# Patient Record
Sex: Female | Born: 2005 | Race: Black or African American | Hispanic: No | Marital: Single | Smoking: Never smoker
Health system: Southern US, Community
[De-identification: ages and names within clinical notes are randomized; demographics above are authoritative.]

## PROBLEM LIST (undated history)

## (undated) ENCOUNTER — Emergency Department (HOSPITAL_COMMUNITY): Admission: EM | Payer: Medicaid Other | Source: Home / Self Care

## (undated) DIAGNOSIS — O24419 Gestational diabetes mellitus in pregnancy, unspecified control: Secondary | ICD-10-CM

## (undated) DIAGNOSIS — D649 Anemia, unspecified: Secondary | ICD-10-CM

## (undated) DIAGNOSIS — R569 Unspecified convulsions: Secondary | ICD-10-CM

## (undated) DIAGNOSIS — A749 Chlamydial infection, unspecified: Secondary | ICD-10-CM

## (undated) HISTORY — PX: NO PAST SURGERIES: SHX2092

---
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
## 2023-01-21 ENCOUNTER — Ambulatory Visit: Payer: Self-pay

## 2023-12-18 ENCOUNTER — Emergency Department (HOSPITAL_COMMUNITY)
Admission: EM | Admit: 2023-12-18 | Discharge: 2023-12-18 | Discharge status: Against Medical Advice | Source: Home / Self Care

## 2023-12-20 ENCOUNTER — Emergency Department (HOSPITAL_COMMUNITY)
Admission: EM | Admit: 2023-12-20 | Discharge: 2023-12-20 | Disposition: A | Discharge status: Home / Self Care | Attending: Emergency Medicine | Admitting: Emergency Medicine

## 2023-12-20 ENCOUNTER — Emergency Department (HOSPITAL_COMMUNITY)

## 2023-12-20 DIAGNOSIS — S61442A Puncture wound with foreign body of left hand, initial encounter: Secondary | ICD-10-CM | POA: Diagnosis present

## 2023-12-20 DIAGNOSIS — Z23 Encounter for immunization: Secondary | ICD-10-CM | POA: Insufficient documentation

## 2023-12-20 DIAGNOSIS — W458XXA Other foreign body or object entering through skin, initial encounter: Secondary | ICD-10-CM | POA: Insufficient documentation

## 2023-12-20 MED ORDER — TETANUS-DIPHTH-ACELL PERTUSSIS 5-2.5-18.5 LF-MCG/0.5 IM SUSY
0.5000 mL | PREFILLED_SYRINGE | Freq: Once | INTRAMUSCULAR | Status: AC
  Administered 2023-12-20: 0.5 mL via INTRAMUSCULAR
  Filled 2023-12-20: qty 0.5

## 2023-12-20 MED ORDER — CEPHALEXIN 500 MG PO CAPS: 500.0000 mg | ORAL_CAPSULE | Freq: Two times a day (BID) | ORAL | 0 refills | Status: AC

## 2023-12-20 MED ORDER — OXYCODONE HCL 5 MG PO TABS: 5.0000 mg | ORAL_TABLET | ORAL | Status: DC

## 2023-12-20 MED ORDER — ACETAMINOPHEN 500 MG PO TABS
1000.0000 mg | ORAL_TABLET | Freq: Once | ORAL | Status: AC
Start: 2023-12-20 — End: 2023-12-20
  Administered 2023-12-20: 1000 mg via ORAL
  Filled 2023-12-20: qty 2

## 2023-12-20 NOTE — ED Notes (Signed)
 EDP at bedside during triage. Assessed and removed object from her left hand. No blood noted.  Ice pack applied to area.

## 2023-12-20 NOTE — ED Provider Notes (Signed)
 [place] EMERGENCY DEPARTMENT AT [facility] Provider Note   CSN: [id number] Arrival date & time: 12/20/23  1533     History  Chief Complaint  Patient presents with   Foreign Body    Vicki Foster is a 18 y.o. female.  18 year old female previously healthy who is right-handed who presents to the emergency department with foreign object in her left hand.  Patient was checking the thermometer of chicken nuggets and accidentally poked it into her left hand just proximal to her left middle finger.  Was not through and through.  Object left in place and 911 was called.  No surgeries to that hand.  Denies any numbness or tingling of the hand distally.       Home Medications Prior to Admission medications   Medication Sig Start Date End Date Taking? Authorizing Provider  cephALEXin  (KEFLEX ) 500 MG capsule Take 1 capsule (500 mg total) by mouth 2 (two) times daily for 7 days. 12/20/23 12/27/23 Yes Ninetta Basket, MD  etonogestrel  (NEXPLANON ) 68 MG IMPL implant 1 each by Subdermal route once.    [provider]  Norethindrone-Ethinyl Estradiol-Fe Biphas (LO LOESTRIN FE) 1 MG-10 MCG / 10 MCG tablet Take 1 tablet by mouth daily. 11/12/22 02/10/23  Ozan, Jennifer, DO  ondansetron  (ZOFRAN -ODT) 4 MG disintegrating tablet Take 1 tablet (4 mg total) by mouth every 8 (eight) hours as needed for nausea or vomiting. 12/15/22   Schutt, Coni Deep, PA-C      Allergies    Tomato    Review of Systems   Review of Systems  Physical Exam Updated Vital Signs BP 131/76   Pulse 88   Temp 99 F (37.2 C) (Oral)   Resp 15   Ht 5\' 6"  (1.676 m)   Wt 83.9 kg   SpO2 100%   BMI 29.86 kg/m  Physical Exam Musculoskeletal:     Comments: See image below for foreign body that is in place.  After removal able to fully flex the middle finger MCP, PIP, and DIP.  Cap refill less than 2 seconds in distal left finger.  Intact sensation to light touch in the left fingers well.  Able to  fully flex and extend all other fingers.  No active bleeding.     ED Results / Procedures / Treatments   Labs (all labs ordered are listed, but only abnormal results are displayed) Labs Reviewed - No data to display  EKG None  Radiology No results found.  Procedures Procedures    Medications Ordered in ED Medications  Tdap (BOOSTRIX ) injection 0.5 mL (0.5 mLs Intramuscular Given 12/20/23 1615)  acetaminophen  (TYLENOL ) tablet 1,000 mg (1,000 mg Oral Given 12/20/23 1617)    ED Course/ Medical Decision Making/ A&P                                 Medical Decision Making Amount and/or Complexity of Data Reviewed Radiology: ordered.  Risk OTC drugs. Prescription drug management.   18 year old right-handed female presents emergency department with a foreign object in her left hand  Initial Ddx:  Foreign body, tendon injury, nerve injury, arterial injury  MDM/Course:  Patient presents emergency department with a meat thermometer that is lodged in her left hand.  It was removed without difficulty.  Does not appear to have any tendon injuries and unable to appreciate.  Neurovascular intact in her finger distally.  X-rays were obtained which did not  show any retained foreign body.  Will give her antibiotics prevent infection given the fact that the needle is in need to monitor.  Will have her follow-up with hand if she is having persistent symptoms.  This patient presents to the ED for concern of complaints listed in HPI, this involves an extensive number of treatment options, and is a complaint that carries with it a high risk of complications and morbidity. Disposition including potential need for admission considered.   Dispo: DC Home. Return precautions discussed including, but not limited to, those listed in the AVS. Allowed pt time to ask questions which were answered fully prior to dc.  Records reviewed Outpatient Clinic Notes I independently reviewed the following imaging  with scope of interpretation limited to determining acute life threatening conditions related to emergency care: Extremity x-ray(s) and agree with the radiologist interpretation with the following exceptions: none I have reviewed the patients home medications and made adjustments as needed  Portions of this note were generated with Dragon dictation software. Dictation errors may occur despite best attempts at proofreading.     Final Clinical Impression(s) / ED Diagnoses Final diagnoses:  Puncture wound of left hand with foreign body, initial encounter    Rx / DC Orders ED Discharge Orders          Ordered    cephALEXin  (KEFLEX ) 500 MG capsule  2 times daily        12/20/23 1603              Ninetta Basket, MD 12/24/23 [phone]

## 2023-12-20 NOTE — ED Triage Notes (Signed)
 Pt arrived [facility] from work with a meat thermometer in in her left hand. NO bleeding noted. Area wrapped by [facility].

## 2023-12-20 NOTE — Discharge Instructions (Signed)
 You were seen for your hand injury in the emergency department.   At home, please take Tylenol  and ibuprofen  for your pain.  Ice your hand to limit the swelling.  Take the antibiotics to prevent infection.    Check your MyChart online for the results of any tests that had not resulted by the time you left the emergency department.   If you are still having pain or any issues moving your finger please follow-up with the hand surgeon in 1 to 2 weeks.  Thank you for visiting our Emergency Department. It was a pleasure taking care of you today.
